# Patient Record
Sex: Male | Born: 2000 | Race: White | Hispanic: No | Marital: Single | Smoking: Never smoker
Health system: Southern US, Community
[De-identification: ages and names within clinical notes are randomized; demographics above are authoritative.]

## PROBLEM LIST (undated history)

## (undated) DIAGNOSIS — G919 Hydrocephalus, unspecified: Secondary | ICD-10-CM

## (undated) DIAGNOSIS — Q0701 Arnold-Chiari syndrome with spina bifida: Secondary | ICD-10-CM

## (undated) DIAGNOSIS — F909 Attention-deficit hyperactivity disorder, unspecified type: Secondary | ICD-10-CM

## (undated) DIAGNOSIS — G43909 Migraine, unspecified, not intractable, without status migrainosus: Secondary | ICD-10-CM

## (undated) DIAGNOSIS — Z982 Presence of cerebrospinal fluid drainage device: Secondary | ICD-10-CM

## (undated) DIAGNOSIS — Q019 Encephalocele, unspecified: Secondary | ICD-10-CM

## (undated) DIAGNOSIS — G950 Syringomyelia and syringobulbia: Secondary | ICD-10-CM

## (undated) DIAGNOSIS — G935 Compression of brain: Secondary | ICD-10-CM

## (undated) HISTORY — DX: Hydrocephalus, unspecified: G91.9

## (undated) HISTORY — DX: Compression of brain: G93.5

## (undated) HISTORY — PX: VENTRICULOPERITONEAL SHUNT: SHX204

## (undated) HISTORY — PX: OTHER SURGICAL HISTORY: SHX169

## (undated) HISTORY — DX: Encephalocele, unspecified: Q01.9

## (undated) HISTORY — DX: Syringomyelia and syringobulbia: G95.0

## (undated) HISTORY — DX: Arnold-Chiari syndrome with spina bifida: Q07.01

## (undated) HISTORY — DX: Migraine, unspecified, not intractable, without status migrainosus: G43.909

## (undated) HISTORY — DX: Attention-deficit hyperactivity disorder, unspecified type: F90.9

---
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
## 2019-11-18 IMAGING — CR DG CERVICAL SPINE 1V
2 series · 2 of 2 positions shown · non-contrast
Comparison: Chest and abdominal radiographs 11/18/2010. Skull
radiographs 10/21/2006.

CLINICAL DATA: 16-year-old male with headache since yesterday
morning, possible shunt malfunction.

EXAM:
SKULL - 1-3 VIEW; CHEST 1 VIEW; ABDOMEN - 1 VIEW; DG CERVICAL SPINE
- 1 VIEW

[skull pa]
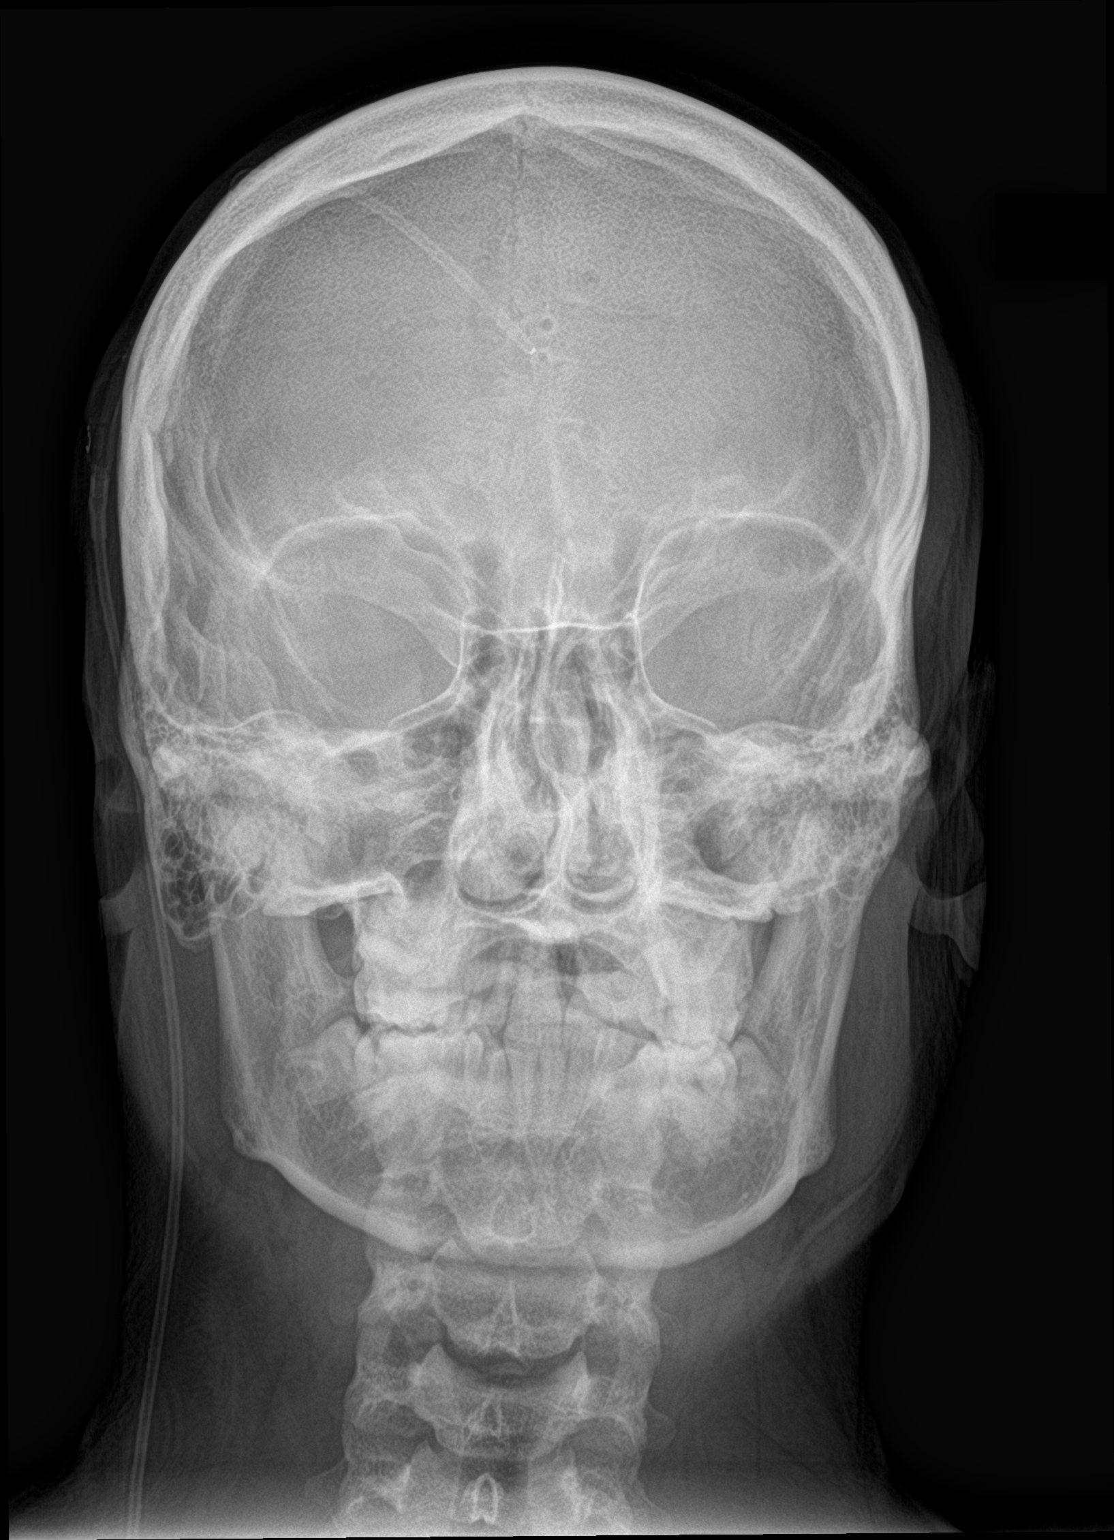

[skull lat]
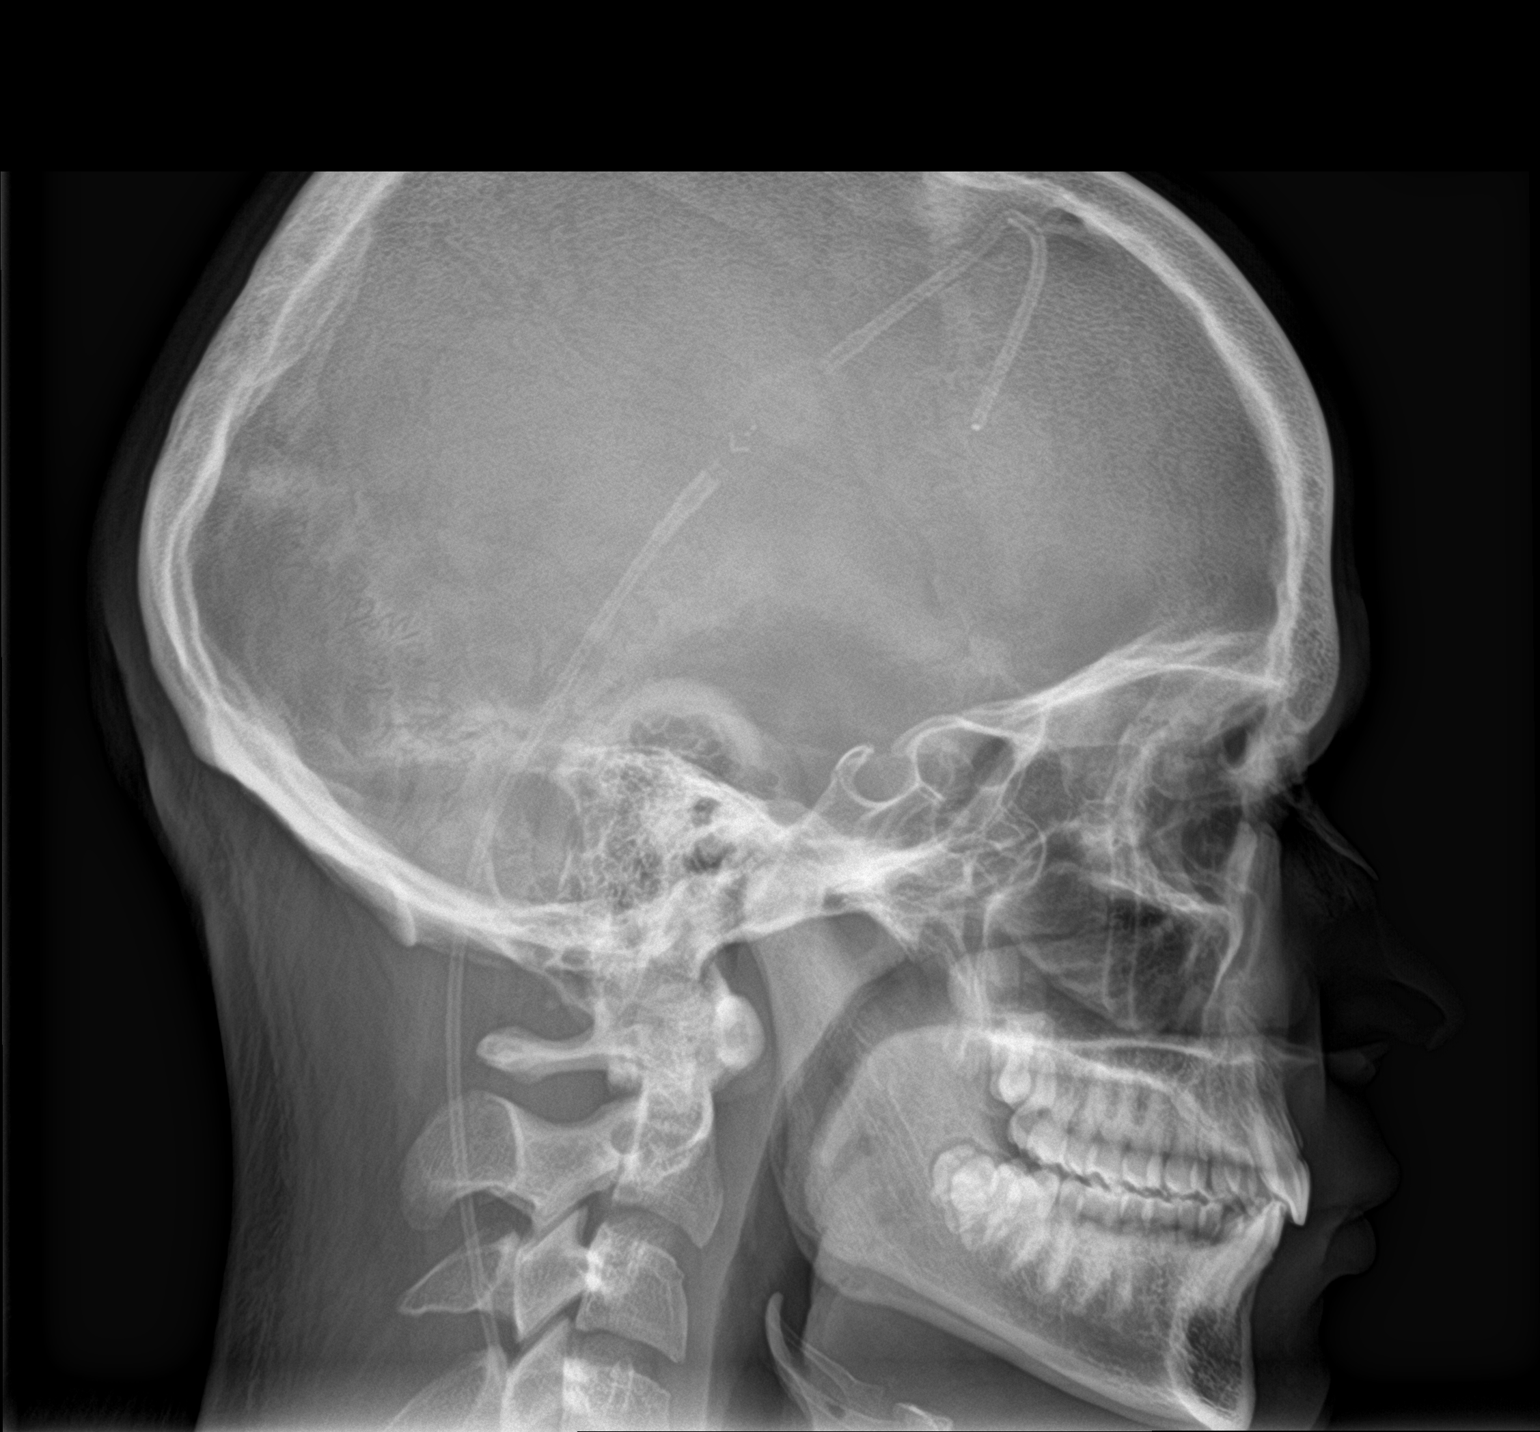

[2 of 2 positions shown; findings below may reference images not displayed]

FINDINGS: A chronic right frontal approach ventricular catheter with right
lateral convexity shunt reservoir is re- demonstrated. The proximal
and distal tubing appears intact and connected. The intracranial
shunt tip courses to the midline. The catheter configuration
scratched at the catheter and reservoir configuration about the
skull appears stable since 5993.

Shunt tubing courses in the right lateral neck to the right chest,
and into the abdomen. The tubing is looped in the epigastrium, and
now terminates in the right mid abdomen. This abdominal tubing
configuration is a change since 8098 whereby a gradual left-sided
abdominal catheter loop was demonstrated. No catheter discontinuity
identified.

Lung volumes remain normal. Normal cardiac size and mediastinal
contours. Visualized tracheal air column is within normal limits.
The lungs are clear. No pneumothorax or pneumoperitoneum. Normal
visible bowel gas pattern and abdominal visceral contours. No acute
osseous abnormality identified.
IMPRESSION: 1. The intracranial and skull portion of the catheter appears stable
since [DATE]. No catheter discontinuity identified along its course from the
head to the right abdomen, however, compact coiling or looping of
the catheter in the epigastrium is new since [DATE].

.

## 2019-12-20 ENCOUNTER — Ambulatory Visit
Admission: EM | Admit: 2019-12-20 | Discharge: 2019-12-20 | Disposition: A | Discharge status: Home / Self Care | Payer: Medicaid Other | Attending: Family Medicine | Admitting: Family Medicine

## 2019-12-20 ENCOUNTER — Other Ambulatory Visit: Payer: Self-pay

## 2019-12-20 ENCOUNTER — Encounter: Payer: Self-pay | Admitting: Emergency Medicine

## 2019-12-20 DIAGNOSIS — B349 Viral infection, unspecified: Secondary | ICD-10-CM | POA: Insufficient documentation

## 2019-12-20 DIAGNOSIS — Z20822 Contact with and (suspected) exposure to covid-19: Secondary | ICD-10-CM | POA: Insufficient documentation

## 2019-12-20 LAB — GROUP A STREP BY PCR: Group A Strep by PCR: NOT DETECTED

## 2019-12-20 MED ORDER — IBUPROFEN 800 MG PO TABS
800.0000 mg | ORAL_TABLET | Freq: Once | ORAL | Status: AC
  Administered 2019-12-20: 800 mg via ORAL

## 2019-12-20 NOTE — Discharge Instructions (Signed)
Tylenol 1000 mg and 800 mg of ibuprofen every 8 hours as needed for pain and fever.  Lots of fluids.  Stay home.  Awaiting Covid test results.

## 2019-12-20 NOTE — ED Triage Notes (Signed)
Pt c/o headache, sore throat, diarrhea, subjective fever, weakness. Started 2 days ago.

## 2019-12-21 LAB — SARS CORONAVIRUS 2 (TAT 6-24 HRS): SARS Coronavirus 2: NEGATIVE

## 2019-12-21 NOTE — ED Provider Notes (Signed)
[facility]    CSN: [id number] Arrival date & time: 12/20/19  1926  History   Chief Complaint Chief Complaint  Patient presents with  . Fever  . Sore Throat   HPI  19 year old male presents with multiple complaints.   2 day history of headache, sore throat, diarrhea, fever, weakness. Currently febrile. Feels poorly. No known contacts with COVID. No relieving factors. No other complaints at this time.   Past Medical History:  Diagnosis Date  . ADHD   . Chiari malformation type I ([facility])   . Chiari malformation type II ([facility])   . Encephalocele ([facility])   . Hydrocephalus ([facility])   . Migraine   . S/P VP shunt   . Syringomyelia [facility])     Patient Active Problem List   Diagnosis Date Noted  . Encephalocele ([facility]) 08/14/2017  . Hydrocephalus ([facility]) 08/14/2017  . Chiari I malformation ([facility]) 08/14/2017  . S/P ventriculoperitoneal shunt 08/14/2017  . Syringomyelia ([facility]) 08/14/2017  . Headache, chronic migraine without aura, intractable, with status 08/14/2017    Past Surgical History:  Procedure Laterality Date  . bilateral inguinal hernia    . SHUNT REMOVAL N/A 09/16/2017   Procedure: DISTAL SHUNT REMOVAL;  Surgeon: Lisbeth Renshaw, MD;  Location: [facility] OR;  Service: Neurosurgery;  Laterality: N/A;  DISTAL SHUNT REMOVAL  . VENTRICULOPERITONEAL SHUNT     and a revision       Home Medications    Prior to Admission medications   Not on File    Family History Family History  Problem Relation Age of Onset  . Migraines Mother   . Migraines Father     Social History Social History   Tobacco Use  . Smoking status: Never Smoker  . Smokeless tobacco: Never Used  Vaping Use  . Vaping Use: Never used  Substance Use Topics  . Alcohol use: Never  . Drug use: Never     Allergies   Patient has no known allergies.   Review of Systems Review of Systems  Constitutional: Positive for fatigue and fever.  HENT: Positive for sore throat.   Gastrointestinal:  Positive for diarrhea.  Neurological: Positive for weakness.   Physical Exam Triage Vital Signs ED Triage Vitals  Enc Vitals Group     BP 12/20/19 1956 124/71     Pulse Rate 12/20/19 1956 (!) 101     Resp 12/20/19 1956 18     Temp 12/20/19 1956 (!) 102.7 F (39.3 C)     Temp Source 12/20/19 1956 Oral     SpO2 12/20/19 1956 100 %     Weight 12/20/19 1953 205 lb 0.4 oz (93 kg)     Height 12/20/19 1953 5\' 8"  (1.727 m)     Head Circumference --      Peak Flow --      Pain Score 12/20/19 1952 5     Pain Loc --      Pain Edu? --      Excl. in GC? --     Updated Vital Signs BP 124/71 (BP Location: Left Arm)   Pulse (!) 101   Temp (!) 102.7 F (39.3 C) (Oral)   Resp 18   Ht 5\' 8"  (1.727 m)   Wt 93 kg   SpO2 100%   BMI 31.17 kg/m   Visual Acuity Right Eye Distance:   Left Eye Distance:   Bilateral Distance:    Right Eye Near:   Left Eye Near:    Bilateral Near:  Physical Exam Vitals and nursing note reviewed.  Constitutional:      General: He is not in acute distress.    Comments: Appears mildly ill.  HENT:     Head: Normocephalic and atraumatic.     Mouth/Throat:     Pharynx: Posterior oropharyngeal erythema present. No oropharyngeal exudate.  Eyes:     General:        Right eye: No discharge.        Left eye: No discharge.     Conjunctiva/sclera: Conjunctivae normal.  Cardiovascular:     Rate and Rhythm: Regular rhythm. Tachycardia present.  Pulmonary:     Effort: Pulmonary effort is normal.     Breath sounds: No wheezing or rales.  Neurological:     Mental Status: He is alert.  Psychiatric:        Mood and Affect: Mood normal.        Behavior: Behavior normal.    UC Treatments / Results  Labs (all labs ordered are listed, but only abnormal results are displayed) Labs Reviewed  SARS CORONAVIRUS 2 (TAT 6-24 HRS)  GROUP A STREP BY PCR    EKG   Radiology No results found.  Procedures Procedures (including critical care  time)  Medications Ordered in UC Medications  ibuprofen (ADVIL) tablet 800 mg (800 mg Oral Given 12/20/19 2003)    Initial Impression / Assessment and Plan / UC Course  I have reviewed the triage vital signs and the nursing notes.  Pertinent labs & imaging results that were available during my care of the patient were reviewed by me and considered in my medical decision making (see chart for details).    19 year old male presents with a viral illness (febrile illness). Strep negative. COVID has now returned negative. Supportive care and Tylenol/Ibuprofen as needed.   Final Clinical Impressions(s) / UC Diagnoses   Final diagnoses:  Viral illness  Suspected COVID-19 virus infection     Discharge Instructions     Tylenol 1000 mg and 800 mg of ibuprofen every 8 hours as needed for pain and fever.  Lots of fluids.  Stay home.  Awaiting Covid test results.   ED Prescriptions    None     PDMP not reviewed this encounter.   Tommie Sams, [place] 12/21/19 2154

## 2019-12-23 ENCOUNTER — Emergency Department
Admission: EM | Admit: 2019-12-23 | Discharge: 2019-12-23 | Disposition: A | Discharge status: Home / Self Care | Payer: Self-pay | Attending: Emergency Medicine | Admitting: Emergency Medicine

## 2019-12-23 ENCOUNTER — Other Ambulatory Visit: Payer: Self-pay

## 2019-12-23 DIAGNOSIS — J020 Streptococcal pharyngitis: Secondary | ICD-10-CM | POA: Insufficient documentation

## 2019-12-23 LAB — GROUP A STREP BY PCR: Group A Strep by PCR: NOT DETECTED

## 2019-12-23 MED ORDER — AMOXICILLIN 500 MG PO TABS: 500.0000 mg | ORAL_TABLET | Freq: Two times a day (BID) | ORAL | 0 refills | Status: DC

## 2019-12-23 MED ORDER — PENICILLIN G BENZATHINE 1200000 UNIT/2ML IM SUSP
1.2000 10*6.[IU] | Freq: Once | INTRAMUSCULAR | Status: AC
  Administered 2019-12-23: 1.2 10*6.[IU] via INTRAMUSCULAR
  Filled 2019-12-23: qty 2

## 2019-12-23 NOTE — ED Triage Notes (Signed)
Patient to ED for sore throat, states he can't eat or swallow. Had negative Covid test at [facility]. They also tested him for strep. States he believes that even though the strep test was negative this feels and smells like strep. Has long history of having strep.

## 2019-12-23 NOTE — ED Notes (Signed)
See triage note. Pt states he cannot tolerate liquids and chloraseptic spray was irritating.

## 2019-12-23 NOTE — ED Provider Notes (Signed)
[facility] Emergency Department Provider Note  ____________________________________________   First MD Initiated Contact with Patient 12/23/19 1154     (approximate)  I have reviewed the triage vital signs and the nursing notes.   HISTORY  Chief Complaint Sore Throat (Can't swallow)  HPI Dean Diaz. is a 19 y.o. male who presents to the emergency department for evaluation of sore throat that has been worsening.  It has been present for the last 5 days, and each day has been progressively worsening, to the point at which now he is unable to eat or drink due to the pain.  He denies changes in his voice.  States he has had strep multiple times before and believes that he has strep throat.  Denies any known sick contacts for strep or Covid.  He was seen in the [facility] urgent care a few days ago and was strep and Covid negative at that time.  He does endorse a fever and has had diarrhea associated as well.  Denies abdominal pain.      Past Medical History:  Diagnosis Date  . ADHD   . Chiari malformation type I (HCC)   . Chiari malformation type II (HCC)   . Encephalocele (HCC)   . Hydrocephalus (HCC)   . Migraine   . S/P VP shunt   . Syringomyelia [facility])     Patient Active Problem List   Diagnosis Date Noted  . Encephalocele (HCC) 08/14/2017  . Hydrocephalus (HCC) 08/14/2017  . Chiari I malformation (HCC) 08/14/2017  . S/P ventriculoperitoneal shunt 08/14/2017  . Syringomyelia (HCC) 08/14/2017  . Headache, chronic migraine without aura, intractable, with status 08/14/2017    Past Surgical History:  Procedure Laterality Date  . bilateral inguinal hernia    . SHUNT REMOVAL N/A 09/16/2017   Procedure: DISTAL SHUNT REMOVAL;  Surgeon: Lisbeth Renshaw, MD;  Location: [facility] OR;  Service: Neurosurgery;  Laterality: N/A;  DISTAL SHUNT REMOVAL  . VENTRICULOPERITONEAL SHUNT     and a revision    Prior to Admission medications   Not on File     Allergies Patient has no known allergies.  Family History  Problem Relation Age of Onset  . Migraines Mother   . Migraines Father     Social History Social History   Tobacco Use  . Smoking status: Never Smoker  . Smokeless tobacco: Never Used  Vaping Use  . Vaping Use: Never used  Substance Use Topics  . Alcohol use: Never  . Drug use: Never    Review of Systems Constitutional: + fever/chills Eyes: No visual changes. ENT: + Sore throat Cardiovascular: Denies chest pain. Respiratory: Denies shortness of breath. Gastrointestinal: No abdominal pain.  No nausea, no vomiting.  + diarrhea.  No constipation. Genitourinary: Negative for dysuria. Musculoskeletal: Negative for back pain. Skin: Negative for rash. Neurological: Negative for headaches, focal weakness or numbness. ____________________________________________   PHYSICAL EXAM:  VITAL SIGNS: ED Triage Vitals  Enc Vitals Group     BP 12/23/19 1450 121/67     Pulse Rate 12/23/19 1134 93     Resp 12/23/19 1450 16     Temp 12/23/19 1134 (!) 100.4 F (38 C)     Temp Source 12/23/19 1134 Oral     SpO2 12/23/19 1450 100 %     Weight 12/23/19 1136 187 lb 12.8 oz (85.2 kg)     Height 12/23/19 1136 5\' 8"  (1.727 m)     Head Circumference --  Peak Flow --      Pain Score 12/23/19 1135 8     Pain Loc --      Pain Edu? --      Excl. in GC? --     Constitutional: Alert and oriented. Well appearing and in no acute distress. Eyes: Conjunctivae are normal.  Head: Atraumatic. Nose: No congestion/rhinnorhea. Mouth/Throat: Mucous membranes are moist.  Erythematous oropharynx with 1+ tonsils with exudate bilaterally. Neck: No stridor.   Lymphatic: There is cervical lymphadenopathy present on the left. Cardiovascular: Normal rate, regular rhythm. Grossly normal heart sounds.  Good peripheral circulation. Respiratory: Normal respiratory effort.  No retractions. Lungs CTAB. Gastrointestinal: Soft and nontender. No  distention. No abdominal bruits. No CVA tenderness. Musculoskeletal: No lower extremity tenderness nor edema.  No joint effusions. Neurologic:  Normal speech and language. No gross focal neurologic deficits are appreciated. No gait instability. Skin:  Skin is warm, dry and intact. No rash noted. Psychiatric: Mood and affect are normal. Speech and behavior are normal.  ____________________________________________   LABS (all labs ordered are listed, but only abnormal results are displayed)  Labs Reviewed  GROUP A STREP BY PCR   ____________________________________________   INITIAL IMPRESSION / ASSESSMENT AND PLAN / ED COURSE  As part of my medical decision making, I reviewed the following data within the electronic MEDICAL RECORD NUMBER Nursing notes reviewed and incorporated and Notes from prior ED visits        Dean Case, [place]. is a 19 year old male who presents to the emergency department for evaluation of worsening sore throat over the last 4 to 5 days.  He has tested negative for strep and Covid at an outside facility.  He comes today for worsening of symptoms despite the negative test prior.  On physical exam there is significant exudate present on the bilateral tonsils as well as left-sided cervical lymphadenopathy.  The patient also denies a cough.  A strep swab was performed today and is also negative.  Despite this, the patient does meet the 4 factors for Cent[facility] criteria and thus will be treated empirically for strep pharyngitis.  At this time, the patient does not believe he can swallow an oral tablet and is opting for injectable antibiotic.  Patient will be given an injection of pen G and will not require any outpatient antibiotic therapy.  Patient is amenable with this plan and will return to the emergency room for any worsening of symptoms.      ____________________________________________   FINAL CLINICAL IMPRESSION(S) / ED DIAGNOSES  Final diagnoses:  Strep pharyngitis       ED Discharge Orders         Ordered    amoxicillin (AMOXIL) 500 MG tablet  2 times daily,   Status:  Discontinued        12/23/19 1433          *Please note:  Dean Mandes. was evaluated in Emergency Department on 12/23/2019 for the symptoms described in the history of present illness. He was evaluated in the context of the global COVID-19 pandemic, which necessitated consideration that the patient might be at risk for infection with the SARS-CoV-2 virus that causes COVID-19. Institutional protocols and algorithms that pertain to the evaluation of patients at risk for COVID-19 are in a state of rapid change based on information released by regulatory bodies including the CDC and federal and state organizations. These policies and algorithms were followed during the patient's care in the ED.  Some ED evaluations  and interventions may be delayed as a result of limited staffing during and the pandemic.*   Note:  This document was prepared using Dragon voice recognition software and may include unintentional dictation errors.    Lucy Chris, PA 12/23/19 1617    Sharyn Creamer, MD 12/24/19 [phone]

## 2021-09-20 ENCOUNTER — Other Ambulatory Visit: Payer: Self-pay

## 2021-09-20 ENCOUNTER — Emergency Department
Admission: EM | Admit: 2021-09-20 | Discharge: 2021-09-20 | Disposition: A | Discharge status: Home / Self Care | Payer: Self-pay | Attending: Student in an Organized Health Care Education/Training Program | Admitting: Student in an Organized Health Care Education/Training Program

## 2021-09-20 DIAGNOSIS — J358 Other chronic diseases of tonsils and adenoids: Secondary | ICD-10-CM | POA: Insufficient documentation

## 2021-09-20 DIAGNOSIS — J0301 Acute recurrent streptococcal tonsillitis: Secondary | ICD-10-CM | POA: Insufficient documentation

## 2021-09-20 LAB — GROUP A STREP BY PCR: Group A Strep by PCR: NOT DETECTED

## 2021-09-20 MED ORDER — AZITHROMYCIN 250 MG PO TABS: ORAL_TABLET | ORAL | 0 refills | Status: AC

## 2022-11-27 ENCOUNTER — Encounter (HOSPITAL_COMMUNITY): Payer: Self-pay

## 2022-11-27 ENCOUNTER — Other Ambulatory Visit: Payer: Self-pay

## 2022-11-27 ENCOUNTER — Emergency Department (HOSPITAL_COMMUNITY)
Admission: EM | Admit: 2022-11-27 | Discharge: 2022-11-27 | Disposition: A | Discharge status: Home / Self Care | Payer: 59 | Attending: Emergency Medicine | Admitting: Emergency Medicine

## 2022-11-27 DIAGNOSIS — K529 Noninfective gastroenteritis and colitis, unspecified: Secondary | ICD-10-CM

## 2022-11-27 DIAGNOSIS — D72829 Elevated white blood cell count, unspecified: Secondary | ICD-10-CM | POA: Insufficient documentation

## 2022-11-27 DIAGNOSIS — R1084 Generalized abdominal pain: Secondary | ICD-10-CM

## 2022-11-27 LAB — URINALYSIS, ROUTINE W REFLEX MICROSCOPIC
Bilirubin Urine: NEGATIVE
Glucose, UA: NEGATIVE mg/dL
Hgb urine dipstick: NEGATIVE
Ketones, ur: 80 mg/dL — AB
Leukocytes,Ua: NEGATIVE
Nitrite: NEGATIVE
Protein, ur: 30 mg/dL — AB
Specific Gravity, Urine: 1.029 (ref 1.005–1.030)
pH: 6 (ref 5.0–8.0)

## 2022-11-27 LAB — COMPREHENSIVE METABOLIC PANEL
ALT: 30 U/L (ref 0–44)
AST: 24 U/L (ref 15–41)
Albumin: 4.8 g/dL (ref 3.5–5.0)
Alkaline Phosphatase: 99 U/L (ref 38–126)
Anion gap: 12 (ref 5–15)
BUN: 11 mg/dL (ref 6–20)
CO2: 23 mmol/L (ref 22–32)
Calcium: 9.7 mg/dL (ref 8.9–10.3)
Chloride: 103 mmol/L (ref 98–111)
Creatinine, Ser: 0.97 mg/dL (ref 0.61–1.24)
GFR, Estimated: >60 mL/min (ref >60)
Glucose, Bld: 91 mg/dL (ref 70–99)
Potassium: 3.4 mmol/L — ABNORMAL LOW (ref 3.5–5.1)
Sodium: 138 mmol/L (ref 135–145)
Total Bilirubin: 1.6 mg/dL — ABNORMAL HIGH (ref 0.3–1.2)
Total Protein: 8.3 g/dL — ABNORMAL HIGH (ref 6.5–8.1)

## 2022-11-27 LAB — CBC
HCT: 45.1 % (ref 39.0–52.0)
Hemoglobin: 15.9 g/dL (ref 13.0–17.0)
MCH: 29.8 pg (ref 26.0–34.0)
MCHC: 35.3 g/dL (ref 30.0–36.0)
MCV: 84.6 fL (ref 80.0–100.0)
Platelets: 258 10*3/uL (ref 150–400)
RBC: 5.33 MIL/uL (ref 4.22–5.81)
RDW: 11.8 % (ref 11.5–15.5)
WBC: 11.5 10*3/uL — ABNORMAL HIGH (ref 4.0–10.5)
nRBC: 0 % (ref 0.0–0.2)

## 2022-11-27 LAB — LIPASE, BLOOD: Lipase: 22 U/L (ref 11–51)

## 2022-11-27 MED ORDER — ONDANSETRON 4 MG PO TBDP: 4.0000 mg | ORAL_TABLET | Freq: Three times a day (TID) | ORAL | 0 refills | Status: AC | PRN

## 2022-11-27 MED ORDER — ONDANSETRON 4 MG PO TBDP: 4.0000 mg | ORAL_TABLET | Freq: Once | ORAL | Status: DC | PRN

## 2022-11-27 NOTE — Discharge Instructions (Addendum)
Take the Zofran as needed for nausea vomiting.  Work note provided.  Return for any new or worse symptoms.  If any abdominal pain gets worse.  Hydrate yourself well with liquids at home to include things like Gatorade.

## 2022-11-27 NOTE — ED Triage Notes (Signed)
Vomited x2 at work yesterday Diarrhea all night and day today  Pt was told he needed doctors note to go back to work  Complains of nausea and ABD pain 7/10

## 2022-11-27 NOTE — ED Provider Notes (Addendum)
[facility] EMERGENCY DEPARTMENT AT [facility] Provider Note   CSN: [id number] Arrival date & time: 11/27/22  1815     History  Chief Complaint  Patient presents with   Abdominal Pain    Dean Case. is a 22 y.o. male.  Patient with a complaint of some generalized abdominal pain vomiting on and off but no vomiting since 12 noon.  Pain she has pain was 7 out of 10 earlier.  But now its like a 2 out of 10 had a bowel movement here.  Had no diarrhea.  Symptoms onset was yesterday.  States he did have diarrhea all night.  But none today.  No blood in the vomit no blood in the diarrhea.  Past medical history significant for status post VP shunt.  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  Patient denies any headaches.  Denies any fevers.  Never used tobacco products.  Patient overall feels that he is feeling much better.  Work requires him to have a note to go back to work.       Home Medications Prior to Admission medications   Not on File      Allergies    Patient has no known allergies.    Review of Systems   Review of Systems  Constitutional:  Negative for chills and fever.  HENT:  Negative for rhinorrhea and sore throat.   Eyes:  Negative for visual disturbance.  Respiratory:  Negative for cough and shortness of breath.   Cardiovascular:  Negative for chest pain and leg swelling.  Gastrointestinal:  Positive for abdominal pain, diarrhea, nausea and vomiting.  Genitourinary:  Negative for dysuria.  Musculoskeletal:  Negative for back pain and neck pain.  Skin:  Negative for rash.  Neurological:  Negative for dizziness, light-headedness and headaches.  Hematological:  Does not bruise/bleed easily.  Psychiatric/Behavioral:  Negative for confusion.     Physical Exam Updated Vital Signs BP 120/83   Pulse 79   Temp 99 F (37.2 C) (Oral)   Resp 16   Ht 1.727 m (5\' 8" )    Wt 77.1 kg   SpO2 99%   BMI 25.85 kg/m  Physical Exam Vitals and nursing note reviewed.  Constitutional:      General: He is not in acute distress.    Appearance: He is well-developed.  HENT:     Head: Normocephalic and atraumatic.     Mouth/Throat:     Mouth: Mucous membranes are dry.  Eyes:     Conjunctiva/sclera: Conjunctivae normal.  Cardiovascular:     Rate and Rhythm: Normal rate and regular rhythm.     Heart sounds: No murmur heard. Pulmonary:     Effort: Pulmonary effort is normal. No respiratory distress.     Breath sounds: Normal breath sounds.  Abdominal:     General: There is no distension.     Palpations: Abdomen is soft. There is no mass.     Tenderness: There is no abdominal tenderness. There is no guarding.     Comments: No tenderness to palpation.  No localized tenderness.  Musculoskeletal:        General: No swelling.     Cervical back: Normal range of motion and neck supple. No rigidity.  Skin:    General: Skin is warm and dry.     Capillary Refill: Capillary refill takes less than 2 seconds.  Neurological:  General: No focal deficit present.     Mental Status: He is alert and oriented to person, place, and time.     Cranial Nerves: No cranial nerve deficit.     Sensory: No sensory deficit.     Motor: No weakness.  Psychiatric:        Mood and Affect: Mood normal.     ED Results / Procedures / Treatments   Labs (all labs ordered are listed, but only abnormal results are displayed) Labs Reviewed  COMPREHENSIVE METABOLIC PANEL - Abnormal; Notable for the following components:      Result Value   Potassium 3.4 (*)    Total Protein 8.3 (*)    Total Bilirubin 1.6 (*)    All other components within normal limits  CBC - Abnormal; Notable for the following components:   WBC 11.5 (*)    All other components within normal limits  LIPASE, BLOOD  URINALYSIS, ROUTINE W REFLEX MICROSCOPIC    EKG None  Radiology No results  found.  Procedures Procedures    Medications Ordered in ED Medications  ondansetron (ZOFRAN-ODT) disintegrating tablet 4 mg (has no administration in time range)    ED Course/ Medical Decision Making/ A&P                                 Medical Decision Making Amount and/or Complexity of Data Reviewed Labs: ordered.  Risk Prescription drug management.   States has been feeling much better since around 12 noon.  Patient without any localized abdominal tenderness.  Do not feel the patient needs CT scan of the abdomen.  Based on symptoms could be likely gastroenteritis.  No one else sick at home.  Patient's lipase is 22 complete metabolic panel electrolytes normal other than potassium is slightly down to 3.4 renal functions normal with GFR greater than 60 total bili up a little bit at 1.6.  Patient has no tenderness on the abdomen in all anion gap is 12.  CBC mild leukocytosis 11.5 hemoglobin 15.9.  Patient without any headache without any neurosymptoms patient nontoxic no acute distress.  Abdomen very soft and nontender.  Urinalysis has been collected and is pending.  If urinalysis has no significant findings we will discharge home with a work note.  Patient will return for any new or worse symptoms.  Urinalysis negative.  Patient stable for discharge home will provide work note.  Will provide Zofran for any nausea or vomiting.   Final Clinical Impression(s) / ED Diagnoses Final diagnoses:  Generalized abdominal pain  Gastroenteritis    Rx / DC Orders ED Discharge Orders     None         Vanetta Mulders, MD 11/27/22 2204    Vanetta Mulders, MD 11/27/22 2232

## 2023-05-31 ENCOUNTER — Encounter (INDEPENDENT_AMBULATORY_CARE_PROVIDER_SITE_OTHER): Payer: Self-pay | Admitting: Otolaryngology
